# Patient Record
Sex: Female | Born: 1990
Health system: Southern US, Community
[De-identification: ages and names within clinical notes are randomized; demographics above are authoritative.]

## PROBLEM LIST (undated history)

## (undated) DIAGNOSIS — Z789 Other specified health status: Secondary | ICD-10-CM

## (undated) HISTORY — PX: WISDOM TOOTH EXTRACTION: SHX21

---
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
## 2013-03-21 IMAGING — CT CT MAXILLOFACIAL W/ CM
3 series · 16 of 47 positions shown, 19 images · IV contrast (omnipaque)
Comparison: None.

CLINICAL DATA: Facial pain and swelling after tooth extraction on
07/23/2011.

CT MAXILLOFACIAL WITH CONTRAST
TECHNIQUE: Multidetector CT imaging of the maxillofacial
structures was performed with intravenous contrast. Multiplanar CT
image reconstructions were also generated.
Contrast: 75mL OMNIPAQUE IOHEXOL 300 MG/ML IV SOLN

[Series 3: orbit/facial 2.0 h30s · axial · 0.33mm/px · z∈[-246,-98]mm · 10 of 86 slices shown, 13 images]
[im 6/86  brain]
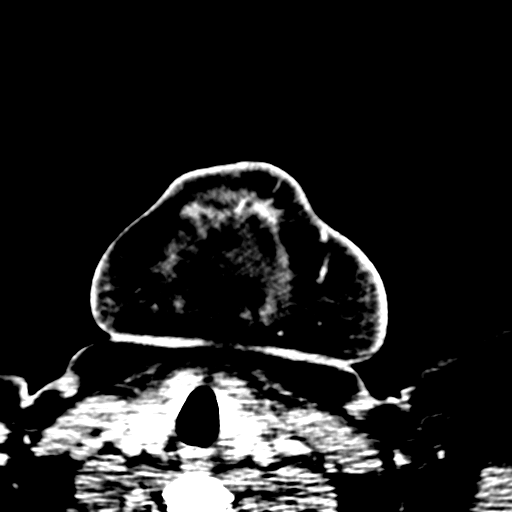
[im 6/86  bone]
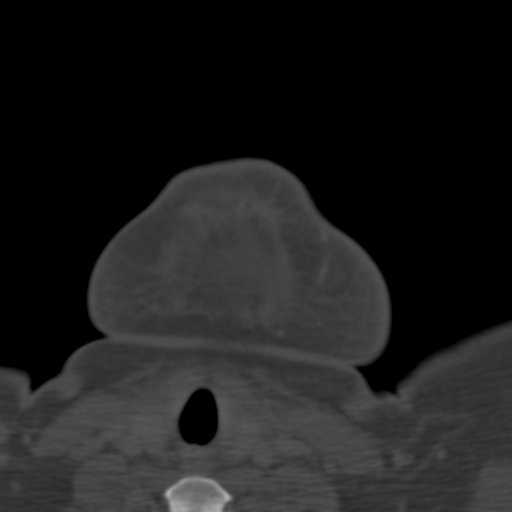
[im 15/86  bone]
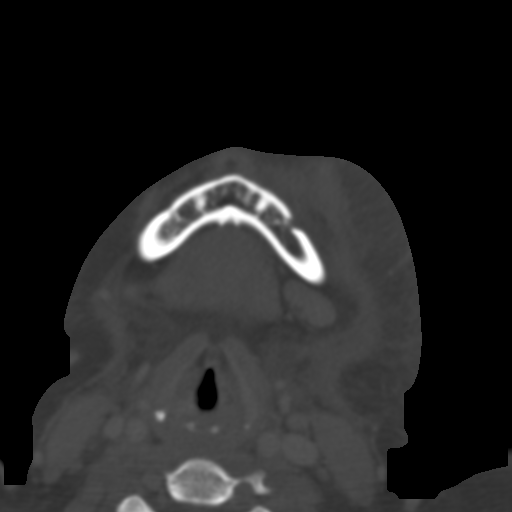
[im 24/86  bone]
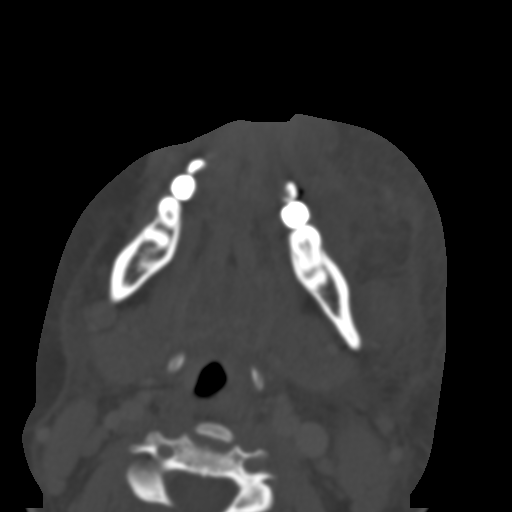
[im 30/86  bone]
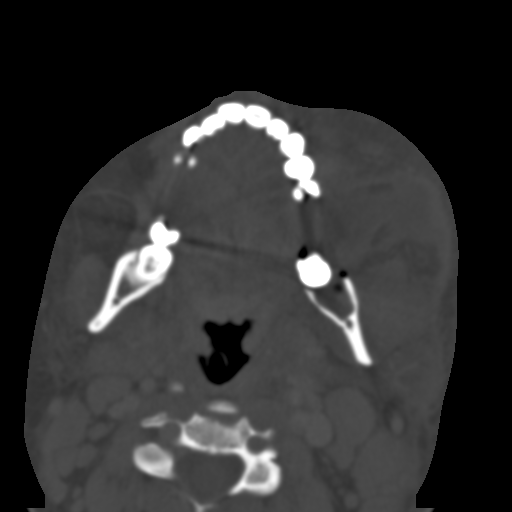
[im 39/86  brain]
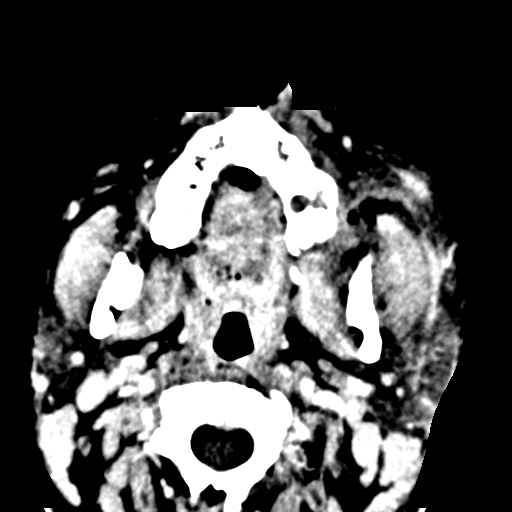
[im 39/86  bone]
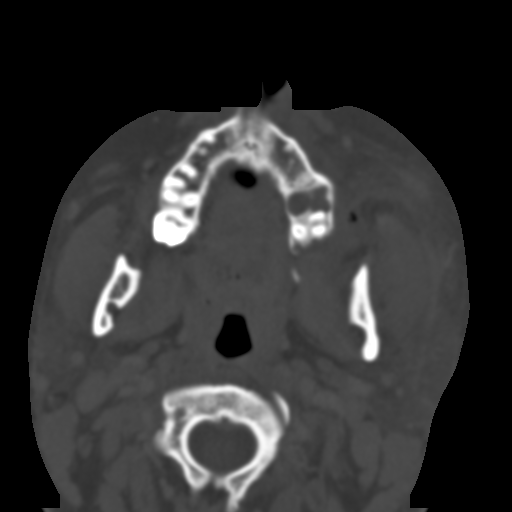
[im 47/86  bone]
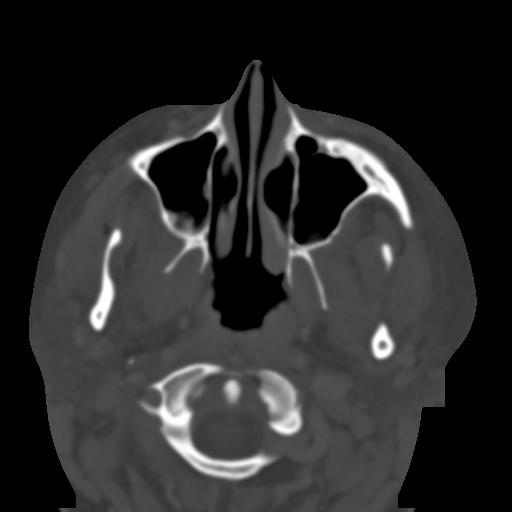
[im 56/86  bone]
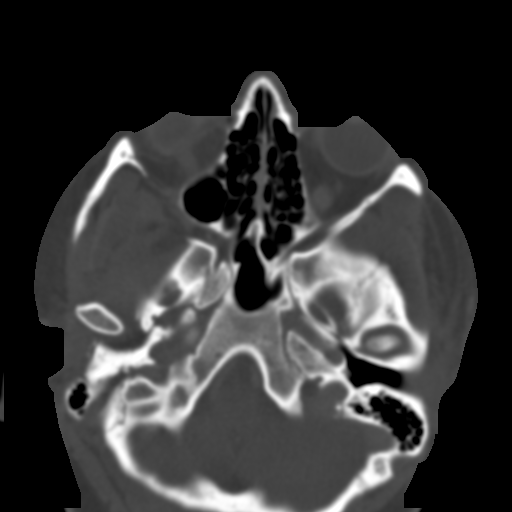
[im 65/86  bone]
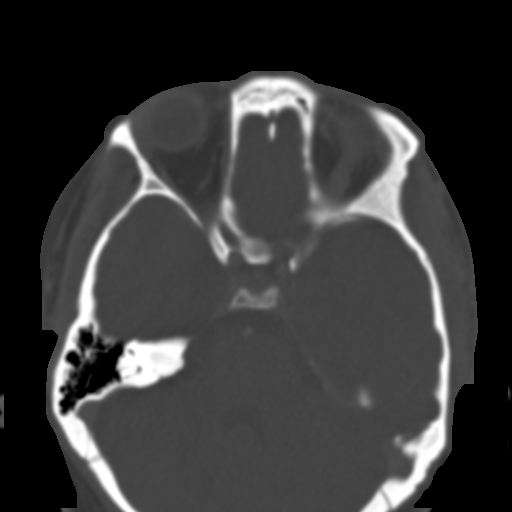
[im 71/86  brain]
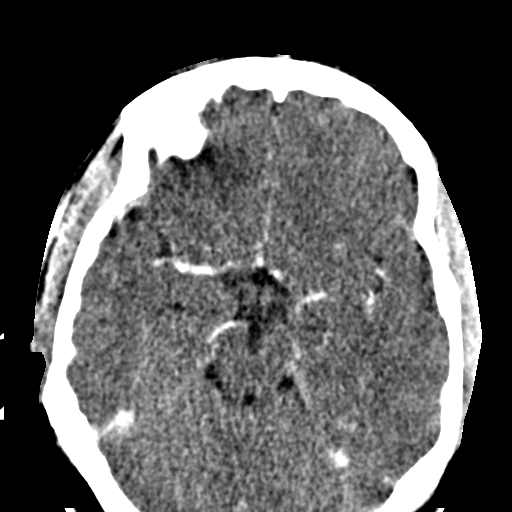
[im 71/86  bone]
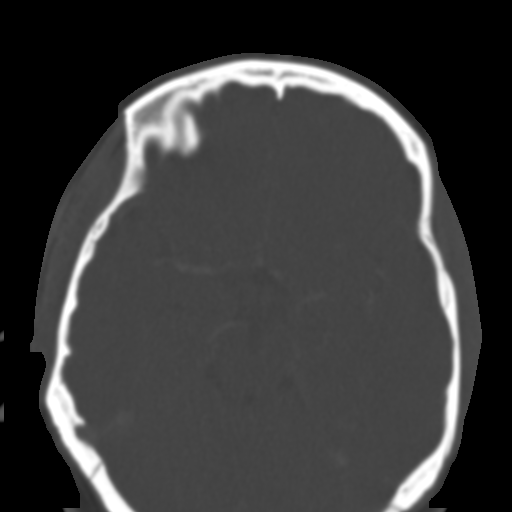
[im 80/86  bone]
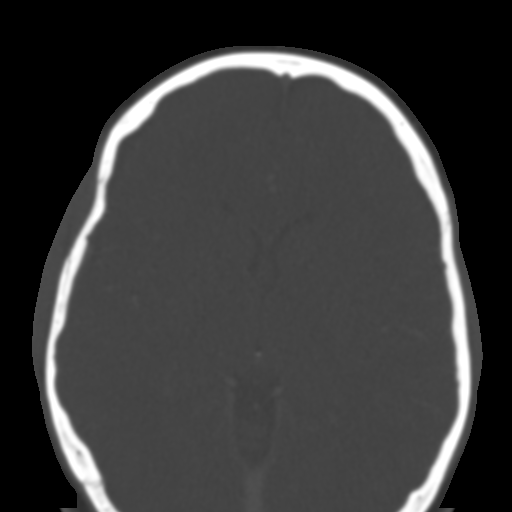

[Series 602: cor soft tissue · coronal · 0.34mm/px · 3 of 65 slices shown]
[im 22/65  bone]
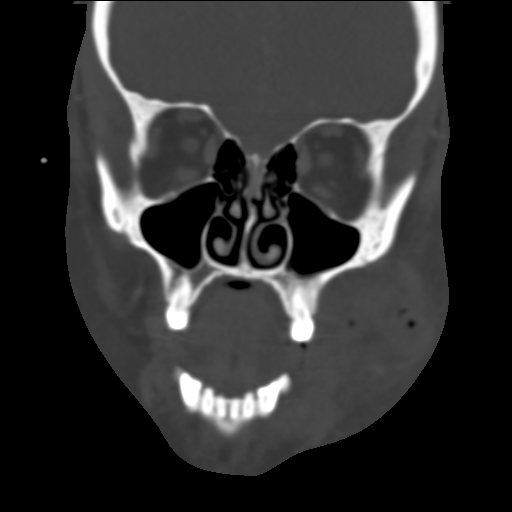
[im 29/65  bone]
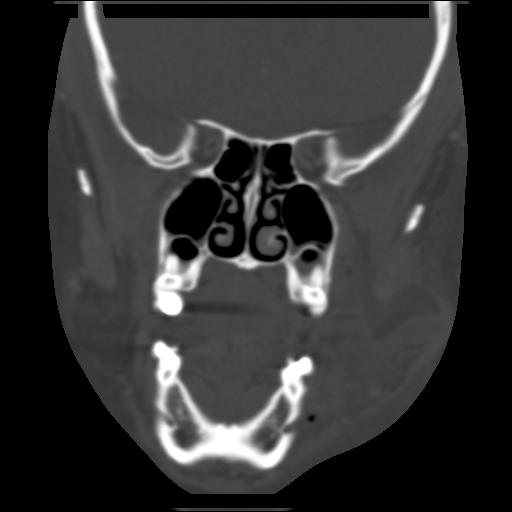
[im 36/65  bone]
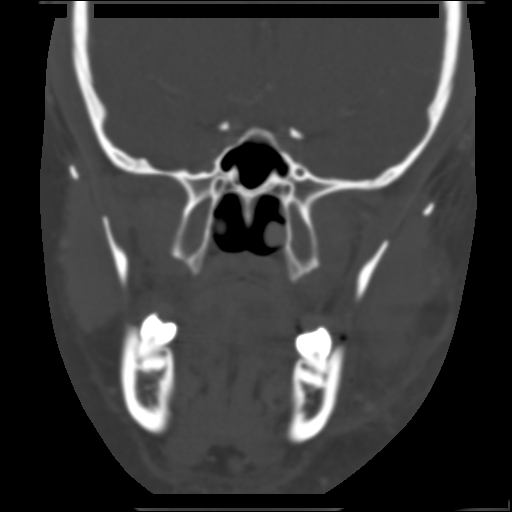

[Series 603: sag soft tissue · sagittal · 0.34mm/px · 3 of 79 slices shown]
[im 27/79  bone]
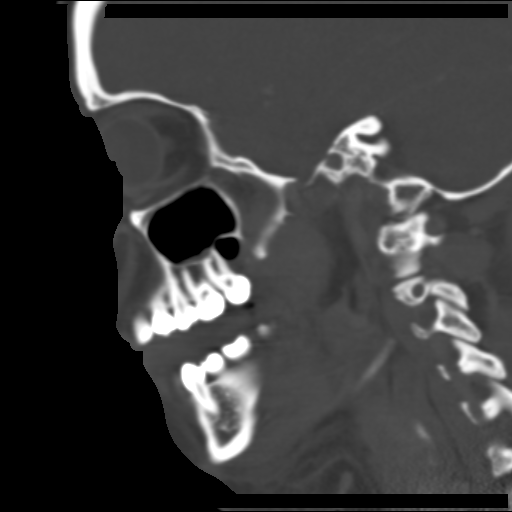
[im 40/79  bone]
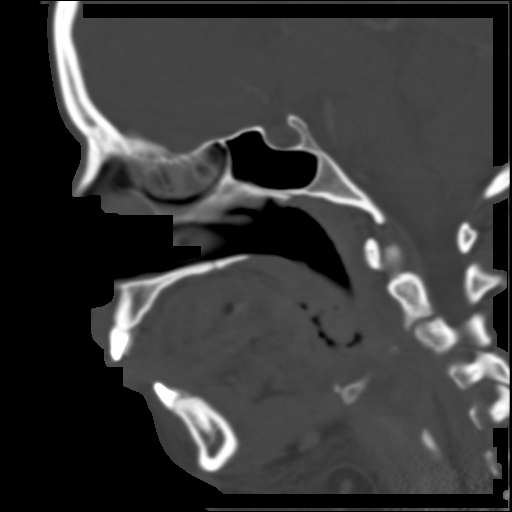
[im 53/79  bone]
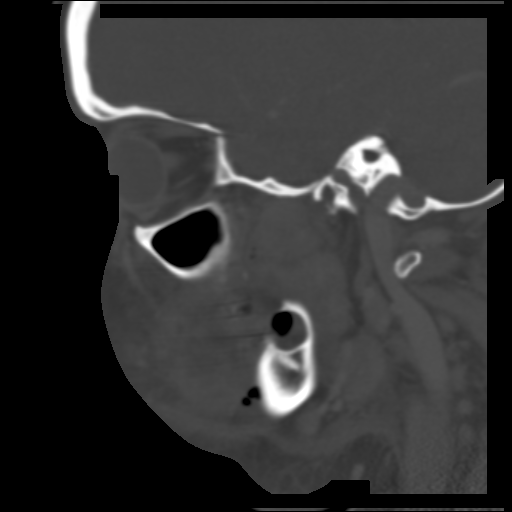

[16 of 47 positions shown; findings below may reference images not displayed]

FINDINGS: The patient had teeth #1, 14, 16, 17, and 32 extracted.
There is an abscess in the soft tissues of the left cheek which
extends adjacent to the sockets of teeth #14, 16, and 17. There is
air in the sockets of teeth # 16 and 17.  The abscess could
originate from any or all of those sockets.  The abscess measures
3.5 x 3.2 x 2.5 cm.

This abscess is deep to the fascia in the buccal subcutaneous fat.

There is slight edema around the left masseter muscle and
superficial to the lateral pterygoid muscle.  There is edema in the
subcutaneous fat of the left cheek and superficial and deep to the
left platysma.  There is slight edema around the inferior aspect of
the left parotid and submandibular glands.

The edema extends across the midline inferiorly adjacent to the
right submandibular gland.

There is bilateral reactive adenopathy in the neck as well as small
nodes in the parotid glands bilaterally.

The patient has minimal mucosal thickening in the bases of both
maxillary sinuses.  Paranasal sinuses are otherwise clear.  Mastoid
air cells are also clear.
IMPRESSION: Abscess in the subcutaneous fat of the left cheek deep to the
fascia.  The abscess extends adjacent to the sockets of teeth 14,
16, and 17.

## 2020-03-14 ENCOUNTER — Other Ambulatory Visit: Payer: Self-pay | Admitting: Obstetrics and Gynecology

## 2020-03-14 DIAGNOSIS — Z3149 Encounter for other procreative investigation and testing: Secondary | ICD-10-CM

## 2020-03-15 ENCOUNTER — Other Ambulatory Visit: Payer: Self-pay | Admitting: Obstetrics and Gynecology

## 2020-03-20 ENCOUNTER — Ambulatory Visit: Payer: Self-pay

## 2020-05-06 ENCOUNTER — Other Ambulatory Visit: Payer: Self-pay

## 2020-05-06 ENCOUNTER — Ambulatory Visit
Admission: RE | Admit: 2020-05-06 | Discharge: 2020-05-06 | Disposition: A | Discharge status: Home / Self Care | Payer: Commercial Managed Care - PPO | Source: Ambulatory Visit | Attending: Obstetrics and Gynecology | Admitting: Obstetrics and Gynecology

## 2020-05-06 DIAGNOSIS — Z3149 Encounter for other procreative investigation and testing: Secondary | ICD-10-CM | POA: Diagnosis not present

## 2020-05-06 DIAGNOSIS — N979 Female infertility, unspecified: Secondary | ICD-10-CM | POA: Diagnosis not present

## 2020-05-06 MED ORDER — IOHEXOL 300 MG/ML  SOLN
20.0000 mL | Freq: Once | INTRAMUSCULAR | Status: AC | PRN
  Administered 2020-05-06: 20 mL

## 2020-05-06 NOTE — Procedures (Signed)
[id number]  [birth year] 28 y.o. @TODAY @  , MD  Hysterosalpingogram Procedure Note  Date of procedure: 05/06/2020   Pre-operative Diagnosis: Infertility  Post-operative Diagnosis: same, patent tubes normal uterus  Procedure: Hysterosalpingogram  Surgeon: 13/07/2019, MD  Assistant(s):  Radiology assistant. The radiologist present for today read the imaging and agreed with findings below.  Anesthesia: None  Estimated Blood Loss:  None         Complications:  None; patient tolerated the procedure well.         Disposition: To home         Condition: stable  Findings: Bilateral fill and spill of the tubes and a normal endometrial contour was noted.  Procedure Details  HSG procedure discussed with the patient.  Risks, complications, alternatives have been reviewed with her and she agrees to proceed.   The patient presented to the radiology lab and was identified as the correct patient and the procedure verified as an HSG. A verbal Time Out was held with all team members present and in agreement.  Speculum was inserted in to the vagina and the cervix visualized.  The cervix was cleaned with betadine solution. The HSG catheter was inserted and the balloon insufflated with approximately 1.5 ml of air.  Patient was then repositioned for fluoroscopy.  A total of 10 ml of contrast was used for the procedure. The patient tolerated the procedure well, no complications.   Bilateral fill and spill of the tubes and a normal endometrial contour was noted.  Results were reviewed with the patient at the time of the procedure. She verbalized understanding.   Cline Cools, MD 05/06/2020

## 2021-07-06 NOTE — L&D Delivery Note (Signed)
Delivery Note  First Stage: Labor onset: 2300 Augmentation : AROM, pitocin Analgesia /Anesthesia intrapartum: epidural x 2 AROM at 0950  Second Stage: Complete dilation at 1420 Onset of pushing at 1422 FHR second stage Category II, recurrent variables. Pushing two contractions then breathing through third contractions to allow fetal recovery  Delivery of a viable female infant 04/25/2022 at 1638 by Lucrezia Europe, CNM. delivery of fetal head in OP position with restitution to ROP. Triple nuchal cord and body cord;  Anterior then posterior shoulders delivered easily with gentle downward traction. Baby placed on mom's chest, and attended to by peds.  Cord double clamped after cessation of pulsation, cut by FOB  Third Stage: Placenta delivered Delena Bali intact with 3 VC @ 1646 Placenta disposition: discarded Uterine tone firm / bleeding scant  2nd degree perineal laceration identified  Anesthesia for repair: epidural Repair 2-0 Vicryl Est. Blood Loss (mL): 403KV  Complications: none  Mom to postpartum.  Baby to Couplet care / Skin to Skin.  Newborn: Birth Weight: TBD, infant skin-to-skin  Apgar Scores: 8, 9 Feeding planned: breastfeeding

## 2021-11-03 DIAGNOSIS — O0993 Supervision of high risk pregnancy, unspecified, third trimester: Secondary | ICD-10-CM | POA: Insufficient documentation

## 2021-12-05 ENCOUNTER — Other Ambulatory Visit: Payer: Self-pay | Admitting: Obstetrics

## 2021-12-05 DIAGNOSIS — Z3689 Encounter for other specified antenatal screening: Secondary | ICD-10-CM

## 2022-01-27 ENCOUNTER — Ambulatory Visit: Payer: PRIVATE HEALTH INSURANCE

## 2022-01-27 ENCOUNTER — Other Ambulatory Visit: Payer: Self-pay

## 2022-01-27 ENCOUNTER — Other Ambulatory Visit: Payer: Self-pay | Admitting: Obstetrics

## 2022-01-27 ENCOUNTER — Ambulatory Visit: Payer: Managed Care, Other (non HMO) | Attending: Obstetrics and Gynecology

## 2022-01-27 DIAGNOSIS — Z363 Encounter for antenatal screening for malformations: Secondary | ICD-10-CM | POA: Insufficient documentation

## 2022-01-27 DIAGNOSIS — E669 Obesity, unspecified: Secondary | ICD-10-CM

## 2022-01-27 DIAGNOSIS — Z3689 Encounter for other specified antenatal screening: Secondary | ICD-10-CM

## 2022-01-27 DIAGNOSIS — Z3A25 25 weeks gestation of pregnancy: Secondary | ICD-10-CM | POA: Diagnosis not present

## 2022-01-27 DIAGNOSIS — O99212 Obesity complicating pregnancy, second trimester: Secondary | ICD-10-CM | POA: Diagnosis present

## 2022-03-05 ENCOUNTER — Other Ambulatory Visit: Payer: Self-pay

## 2022-03-05 DIAGNOSIS — O99213 Obesity complicating pregnancy, third trimester: Secondary | ICD-10-CM

## 2022-03-05 DIAGNOSIS — O0990 Supervision of high risk pregnancy, unspecified, unspecified trimester: Secondary | ICD-10-CM

## 2022-03-10 ENCOUNTER — Other Ambulatory Visit: Payer: Self-pay

## 2022-03-10 ENCOUNTER — Ambulatory Visit: Payer: Managed Care, Other (non HMO) | Attending: Obstetrics and Gynecology

## 2022-03-10 DIAGNOSIS — O99213 Obesity complicating pregnancy, third trimester: Secondary | ICD-10-CM

## 2022-03-10 DIAGNOSIS — E669 Obesity, unspecified: Secondary | ICD-10-CM | POA: Insufficient documentation

## 2022-03-10 DIAGNOSIS — O359XX0 Maternal care for (suspected) fetal abnormality and damage, unspecified, not applicable or unspecified: Secondary | ICD-10-CM

## 2022-03-10 DIAGNOSIS — Z3A31 31 weeks gestation of pregnancy: Secondary | ICD-10-CM | POA: Diagnosis not present

## 2022-03-10 DIAGNOSIS — O0990 Supervision of high risk pregnancy, unspecified, unspecified trimester: Secondary | ICD-10-CM

## 2022-04-02 ENCOUNTER — Encounter
Admission: RE | Admit: 2022-04-02 | Discharge: 2022-04-02 | Disposition: A | Discharge status: Home / Self Care | Payer: Managed Care, Other (non HMO) | Source: Ambulatory Visit | Attending: Anesthesiology | Admitting: Anesthesiology

## 2022-04-02 NOTE — Consult Note (Signed)
[facility] Anesthesia Consultation  Vanessa Jimenez Vanessa Jimenez:[id number] DOB: July 03, 1991 DOA: 04/02/2022 PCP: Marinda Elk, MD   Requesting physician: Lucrezia Europe, CNM Date of consultation: 04/02/22 Reason for consultation: Obesity during pregnancy  CHIEF COMPLAINT:  Obesity during pregnancy  HISTORY OF PRESENT ILLNESS: Vanessa Jimenez  is a 31 y.o. female with a known history of class 3 obesity and GERD with pregnancy.  PAST MEDICAL HISTORY:  No past medical history on file.  PAST SURGICAL HISTORY:  Past Surgical History:  Procedure Laterality Date   WISDOM TOOTH EXTRACTION     All 4    SOCIAL HISTORY:  Social History   Tobacco Use   Smoking status: Never   Smokeless tobacco: Not on file  Substance Use Topics   Alcohol use: No    FAMILY HISTORY:  Family History  Problem Relation Age of Onset   Hypertension Mother    Hypertension Father    Hypertension Brother    Cancer Maternal Grandmother    Hypertension Maternal Grandmother    Hyperlipidemia Maternal Grandfather    Hypertension Maternal Grandfather    Hypertension Paternal Grandfather     DRUG ALLERGIES: No Known Allergies  REVIEW OF SYSTEMS:   RESPIRATORY: No cough, shortness of breath, wheezing.  CARDIOVASCULAR: No chest pain, orthopnea, edema.  HEMATOLOGY: No anemia, easy bruising or bleeding SKIN: No rash or lesion. NEUROLOGIC: No tingling, numbness, weakness.  PSYCHIATRY: No anxiety or depression.   MEDICATIONS AT HOME:  Prior to Admission medications   Medication Sig Start Date End Date Taking? Authorizing Provider  acetaminophen (TYLENOL) 325 MG tablet Take 325 mg by mouth every 6 (six) hours as needed. 1 as needed for pain/headache    [provider]  aspirin 81 MG chewable tablet Chew 81 mg by mouth daily.    [provider]  docusate sodium (COLACE) 50 MG capsule Take 50 mg by mouth daily as needed for mild constipation.     [provider]  lactobacillus acidophilus (BACID) TABS tablet Take 2 tablets by mouth daily. Generic probiotic    [provider]  Prenatal Vit-Fe Fumarate-FA (PRENATAL MULTIVITAMIN) TABS tablet Take 1 tablet by mouth daily at 12 noon.    [provider]      PHYSICAL EXAMINATION:   VITAL SIGNS: Last menstrual period 07/31/2021.  GENERAL:  31 y.o.-year-old patient no acute distress.  HEENT: Head atraumatic, normocephalic. Oropharynx and nasopharynx clear. MP 3, TM distance >3 cm, normal mouth opening. LUNGS: No use of accessory muscles of respiration.   EXTREMITIES: No pedal edema, cyanosis, or clubbing.  NEUROLOGIC: normal gait PSYCHIATRIC: The patient is alert and oriented x 3.  SKIN: No obvious rash, lesion, or ulcer.    IMPRESSION AND PLAN:   Vanessa Jimenez  is a 31 y.o. female presenting with obesity during pregnancy. BMI is currently 45 at [redacted] weeks gestation.   We discussed analgesic options during labor including epidural analgesia. Discussed that in obesity there can be increased difficulty with epidural placement or even failure of successful epidural. We also discussed that even after successful epidural placement there is increased risk of catheter migration out of the epidural space that would require catheter replacement. Discussed use of epidural vs spinal vs GA if cesarean delivery is required. Discussed increased risk of difficult intubation during pregnancy should an emergency cesarean delivery be required.   This patient is appropriate for anesthetic care at [facility] in [place].  We discussed the limitations of a community hospital including but not limited  to staffing, imaging, medications, and blood products.  The patient is aware of these limitations.  All questions answered and concerns addressed.

## 2022-04-07 ENCOUNTER — Other Ambulatory Visit: Payer: PRIVATE HEALTH INSURANCE

## 2022-04-24 ENCOUNTER — Inpatient Hospital Stay
Admission: EM | Admit: 2022-04-24 | Discharge: 2022-04-27 | DRG: 807 | Disposition: A | Discharge status: Home / Self Care | Payer: Managed Care, Other (non HMO) | Attending: Obstetrics | Admitting: Obstetrics

## 2022-04-24 ENCOUNTER — Encounter: Payer: Self-pay | Admitting: Obstetrics and Gynecology

## 2022-04-24 ENCOUNTER — Other Ambulatory Visit: Payer: Self-pay

## 2022-04-24 DIAGNOSIS — O134 Gestational [pregnancy-induced] hypertension without significant proteinuria, complicating childbirth: Principal | ICD-10-CM | POA: Diagnosis present

## 2022-04-24 DIAGNOSIS — O99824 Streptococcus B carrier state complicating childbirth: Secondary | ICD-10-CM | POA: Diagnosis present

## 2022-04-24 DIAGNOSIS — O99214 Obesity complicating childbirth: Secondary | ICD-10-CM | POA: Diagnosis present

## 2022-04-24 DIAGNOSIS — Z7982 Long term (current) use of aspirin: Secondary | ICD-10-CM

## 2022-04-24 DIAGNOSIS — O133 Gestational [pregnancy-induced] hypertension without significant proteinuria, third trimester: Principal | ICD-10-CM | POA: Diagnosis present

## 2022-04-24 DIAGNOSIS — Z3A37 37 weeks gestation of pregnancy: Secondary | ICD-10-CM

## 2022-04-24 HISTORY — DX: Other specified health status: Z78.9

## 2022-04-25 ENCOUNTER — Inpatient Hospital Stay: Payer: Managed Care, Other (non HMO) | Admitting: Anesthesiology

## 2022-04-25 ENCOUNTER — Encounter: Payer: Self-pay | Admitting: Obstetrics and Gynecology

## 2022-04-25 DIAGNOSIS — O99214 Obesity complicating childbirth: Secondary | ICD-10-CM | POA: Diagnosis present

## 2022-04-25 DIAGNOSIS — O26893 Other specified pregnancy related conditions, third trimester: Secondary | ICD-10-CM | POA: Diagnosis present

## 2022-04-25 DIAGNOSIS — Z7982 Long term (current) use of aspirin: Secondary | ICD-10-CM | POA: Diagnosis not present

## 2022-04-25 DIAGNOSIS — O99824 Streptococcus B carrier state complicating childbirth: Secondary | ICD-10-CM | POA: Diagnosis present

## 2022-04-25 DIAGNOSIS — O134 Gestational [pregnancy-induced] hypertension without significant proteinuria, complicating childbirth: Secondary | ICD-10-CM | POA: Diagnosis present

## 2022-04-25 DIAGNOSIS — Z3A37 37 weeks gestation of pregnancy: Secondary | ICD-10-CM | POA: Diagnosis not present

## 2022-04-25 DIAGNOSIS — O133 Gestational [pregnancy-induced] hypertension without significant proteinuria, third trimester: Principal | ICD-10-CM | POA: Diagnosis present

## 2022-04-25 LAB — PROTEIN / CREATININE RATIO, URINE
Creatinine, Urine: 191 mg/dL
Protein Creatinine Ratio: 0.06 mg/mg{Cre} (ref 0.00–0.15)
Total Protein, Urine: 12 mg/dL

## 2022-04-25 LAB — COMPREHENSIVE METABOLIC PANEL
ALT: 21 U/L (ref 0–44)
AST: 20 U/L (ref 15–41)
Albumin: 3 g/dL — ABNORMAL LOW (ref 3.5–5.0)
Alkaline Phosphatase: 125 U/L (ref 38–126)
Anion gap: 9 (ref 5–15)
BUN: 10 mg/dL (ref 6–20)
CO2: 19 mmol/L — ABNORMAL LOW (ref 22–32)
Calcium: 8.7 mg/dL — ABNORMAL LOW (ref 8.9–10.3)
Chloride: 106 mmol/L (ref 98–111)
Creatinine, Ser: 0.43 mg/dL — ABNORMAL LOW (ref 0.44–1.00)
GFR, Estimated: >60 mL/min (ref >60)
Glucose, Bld: 120 mg/dL — ABNORMAL HIGH (ref 70–99)
Potassium: 3.8 mmol/L (ref 3.5–5.1)
Sodium: 134 mmol/L — ABNORMAL LOW (ref 135–145)
Total Bilirubin: 0.9 mg/dL (ref 0.3–1.2)
Total Protein: 7.1 g/dL (ref 6.5–8.1)

## 2022-04-25 LAB — ABO/RH: ABO/RH(D): A POS

## 2022-04-25 LAB — TYPE AND SCREEN
ABO/RH(D): A POS
Antibody Screen: NEGATIVE

## 2022-04-25 LAB — CBC
HCT: 41 % (ref 36.0–46.0)
Hemoglobin: 13.8 g/dL (ref 12.0–15.0)
MCH: 28.6 pg (ref 26.0–34.0)
MCHC: 33.7 g/dL (ref 30.0–36.0)
MCV: 85.1 fL (ref 80.0–100.0)
Platelets: 355 10*3/uL (ref 150–400)
RBC: 4.82 MIL/uL (ref 3.87–5.11)
RDW: 13.6 % (ref 11.5–15.5)
WBC: 15.2 10*3/uL — ABNORMAL HIGH (ref 4.0–10.5)
nRBC: 0 % (ref 0.0–0.2)

## 2022-04-25 LAB — RPR: RPR Ser Ql: NONREACTIVE

## 2022-04-25 MED ORDER — LIDOCAINE-EPINEPHRINE (PF) 1.5 %-1:200000 IJ SOLN
INTRAMUSCULAR | Status: DC | PRN
  Administered 2022-04-25 (×2): 3 mL via EPIDURAL

## 2022-04-25 MED ORDER — BUPIVACAINE HCL (PF) 0.25 % IJ SOLN
INTRAMUSCULAR | Status: DC | PRN
  Administered 2022-04-25: 8 mL via EPIDURAL

## 2022-04-25 MED ORDER — PHENYLEPHRINE 80 MCG/ML (10ML) SYRINGE FOR IV PUSH (FOR BLOOD PRESSURE SUPPORT): 80.0000 ug | PREFILLED_SYRINGE | INTRAVENOUS | Status: DC | PRN

## 2022-04-25 MED ORDER — OXYTOCIN-SODIUM CHLORIDE 30-0.9 UT/500ML-% IV SOLN
2.5000 [IU]/h | INTRAVENOUS | Status: DC
  Administered 2022-04-25: 2.5 [IU]/h via INTRAVENOUS
  Filled 2022-04-25: qty 500

## 2022-04-25 MED ORDER — FENTANYL CITRATE (PF) 100 MCG/2ML IJ SOLN
50.0000 ug | INTRAMUSCULAR | Status: DC | PRN
  Administered 2022-04-25: 100 ug via INTRAVENOUS
  Filled 2022-04-25 (×2): qty 2

## 2022-04-25 MED ORDER — FENTANYL-BUPIVACAINE-NACL 0.5-0.125-0.9 MG/250ML-% EP SOLN
12.0000 mL/h | EPIDURAL | Status: DC | PRN
  Administered 2022-04-25 (×2): 12 mL/h via EPIDURAL
  Filled 2022-04-25: qty 250

## 2022-04-25 MED ORDER — SIMETHICONE 80 MG PO CHEW: 80.0000 mg | CHEWABLE_TABLET | ORAL | Status: DC | PRN

## 2022-04-25 MED ORDER — SODIUM CHLORIDE 0.9 % IV SOLN
5.0000 10*6.[IU] | Freq: Once | INTRAVENOUS | Status: AC
  Administered 2022-04-25: 5 10*6.[IU] via INTRAVENOUS
  Filled 2022-04-25: qty 5

## 2022-04-25 MED ORDER — ONDANSETRON HCL 4 MG PO TABS: 4.0000 mg | ORAL_TABLET | ORAL | Status: DC | PRN

## 2022-04-25 MED ORDER — TERBUTALINE SULFATE 1 MG/ML IJ SOLN: 0.2500 mg | Freq: Once | INTRAMUSCULAR | Status: DC | PRN

## 2022-04-25 MED ORDER — WITCH HAZEL-GLYCERIN EX PADS
1.0000 | MEDICATED_PAD | CUTANEOUS | Status: DC | PRN
  Filled 2022-04-25: qty 100

## 2022-04-25 MED ORDER — COCONUT OIL OIL: 1.0000 | TOPICAL_OIL | Status: DC | PRN

## 2022-04-25 MED ORDER — FENTANYL CITRATE (PF) 100 MCG/2ML IJ SOLN
INTRAMUSCULAR | Status: DC | PRN
  Administered 2022-04-25: 100 ug via EPIDURAL

## 2022-04-25 MED ORDER — COCONUT OIL OIL
TOPICAL_OIL | Status: AC
  Filled 2022-04-25: qty 7.5

## 2022-04-25 MED ORDER — SOD CITRATE-CITRIC ACID 500-334 MG/5ML PO SOLN: 30.0000 mL | ORAL | Status: DC | PRN

## 2022-04-25 MED ORDER — ONDANSETRON HCL 4 MG/2ML IJ SOLN: 4.0000 mg | INTRAMUSCULAR | Status: DC | PRN

## 2022-04-25 MED ORDER — DIBUCAINE (PERIANAL) 1 % EX OINT
1.0000 | TOPICAL_OINTMENT | CUTANEOUS | Status: DC | PRN
  Filled 2022-04-25: qty 28

## 2022-04-25 MED ORDER — LIDOCAINE HCL (PF) 1 % IJ SOLN
INTRAMUSCULAR | Status: DC | PRN
  Administered 2022-04-25: 3 mL via SUBCUTANEOUS

## 2022-04-25 MED ORDER — LACTATED RINGERS IV SOLN
500.0000 mL | INTRAVENOUS | Status: DC | PRN
  Administered 2022-04-25: 500 mL via INTRAVENOUS

## 2022-04-25 MED ORDER — PENICILLIN G POT IN DEXTROSE 60000 UNIT/ML IV SOLN
3.0000 10*6.[IU] | INTRAVENOUS | Status: DC
  Administered 2022-04-25 (×3): 3 10*6.[IU] via INTRAVENOUS
  Filled 2022-04-25 (×3): qty 50

## 2022-04-25 MED ORDER — LIDOCAINE HCL (PF) 1 % IJ SOLN: 30.0000 mL | INTRAMUSCULAR | Status: DC | PRN

## 2022-04-25 MED ORDER — BENZOCAINE-MENTHOL 20-0.5 % EX AERO
1.0000 | INHALATION_SPRAY | CUTANEOUS | Status: DC | PRN
  Filled 2022-04-25: qty 56

## 2022-04-25 MED ORDER — OXYTOCIN BOLUS FROM INFUSION
333.0000 mL | Freq: Once | INTRAVENOUS | Status: AC
  Administered 2022-04-25: 333 mL via INTRAVENOUS

## 2022-04-25 MED ORDER — SENNOSIDES-DOCUSATE SODIUM 8.6-50 MG PO TABS
2.0000 | ORAL_TABLET | Freq: Every day | ORAL | Status: DC
  Administered 2022-04-26 – 2022-04-27 (×2): 2 via ORAL
  Filled 2022-04-25 (×2): qty 2

## 2022-04-25 MED ORDER — EPHEDRINE 5 MG/ML INJ: 10.0000 mg | INTRAVENOUS | Status: DC | PRN

## 2022-04-25 MED ORDER — ZOLPIDEM TARTRATE 5 MG PO TABS: 5.0000 mg | ORAL_TABLET | Freq: Every evening | ORAL | Status: DC | PRN

## 2022-04-25 MED ORDER — DIPHENHYDRAMINE HCL 25 MG PO CAPS: 25.0000 mg | ORAL_CAPSULE | Freq: Four times a day (QID) | ORAL | Status: DC | PRN

## 2022-04-25 MED ORDER — FENTANYL-BUPIVACAINE-NACL 0.5-0.125-0.9 MG/250ML-% EP SOLN
EPIDURAL | Status: AC
  Filled 2022-04-25: qty 250

## 2022-04-25 MED ORDER — SODIUM CHLORIDE 0.9 % IV SOLN
INTRAVENOUS | Status: DC | PRN
  Administered 2022-04-25: 10 mL via EPIDURAL
  Administered 2022-04-25: 8 mL via EPIDURAL

## 2022-04-25 MED ORDER — ACETAMINOPHEN 325 MG PO TABS
650.0000 mg | ORAL_TABLET | ORAL | Status: DC | PRN
  Administered 2022-04-26: 650 mg via ORAL
  Filled 2022-04-25: qty 2

## 2022-04-25 MED ORDER — LACTATED RINGERS IV SOLN: INTRAVENOUS | Status: DC

## 2022-04-25 MED ORDER — TETANUS-DIPHTH-ACELL PERTUSSIS 5-2.5-18.5 LF-MCG/0.5 IM SUSY: 0.5000 mL | PREFILLED_SYRINGE | Freq: Once | INTRAMUSCULAR | Status: DC

## 2022-04-25 MED ORDER — ONDANSETRON HCL 4 MG/2ML IJ SOLN
4.0000 mg | Freq: Four times a day (QID) | INTRAMUSCULAR | Status: DC | PRN
  Administered 2022-04-25: 4 mg via INTRAVENOUS
  Filled 2022-04-25: qty 2

## 2022-04-25 MED ORDER — OXYTOCIN-SODIUM CHLORIDE 30-0.9 UT/500ML-% IV SOLN
1.0000 m[IU]/min | INTRAVENOUS | Status: DC
  Administered 2022-04-25: 2 m[IU]/min via INTRAVENOUS

## 2022-04-25 MED ORDER — IBUPROFEN 600 MG PO TABS
600.0000 mg | ORAL_TABLET | Freq: Four times a day (QID) | ORAL | Status: DC
  Administered 2022-04-25 – 2022-04-27 (×7): 600 mg via ORAL
  Filled 2022-04-25 (×7): qty 1

## 2022-04-25 MED ORDER — PRENATAL MULTIVITAMIN CH
1.0000 | ORAL_TABLET | Freq: Every day | ORAL | Status: DC
  Administered 2022-04-26: 1 via ORAL
  Filled 2022-04-25: qty 1

## 2022-04-25 MED ORDER — ACETAMINOPHEN 325 MG PO TABS: 650.0000 mg | ORAL_TABLET | ORAL | Status: DC | PRN

## 2022-04-25 MED ORDER — LACTATED RINGERS IV SOLN
500.0000 mL | Freq: Once | INTRAVENOUS | Status: AC
  Administered 2022-04-25: 500 mL via INTRAVENOUS

## 2022-04-25 MED ORDER — DIPHENHYDRAMINE HCL 50 MG/ML IJ SOLN: 12.5000 mg | INTRAMUSCULAR | Status: DC | PRN

## 2022-04-25 NOTE — Anesthesia Preprocedure Evaluation (Addendum)
Anesthesia Evaluation  Patient identified by MRN, date of birth, ID band Patient awake    Reviewed: Allergy & Precautions, NPO status , Patient's Chart, lab work & pertinent test results  History of Anesthesia Complications Negative for: history of anesthetic complications  Airway Mallampati: III   Neck ROM: Full    Dental no notable dental hx.    Pulmonary neg pulmonary ROS,    Pulmonary exam normal breath sounds clear to auscultation       Cardiovascular Exercise Tolerance: Good negative cardio ROS Normal cardiovascular exam Rhythm:Regular Rate:Normal     Neuro/Psych negative neurological ROS     GI/Hepatic negative GI ROS,   Endo/Other  Class 3 obesity  Renal/GU negative Renal ROS     Musculoskeletal   Abdominal   Peds  Hematology negative hematology ROS (+)   Anesthesia Other Findings 31 yo G1P0 at 61 2/7 requesting labor epidural.  Reproductive/Obstetrics                            Anesthesia Physical Anesthesia Plan  ASA: 3  Anesthesia Plan: Epidural   Post-op Pain Management:    Induction:   PONV Risk Score and Plan: 2 and Treatment may vary due to age or medical condition  Airway Management Planned: Natural Airway  Additional Equipment:   Intra-op Plan:   Post-operative Plan:   Informed Consent: I have reviewed the patients History and Physical, chart, labs and discussed the procedure including the risks, benefits and alternatives for the proposed anesthesia with the patient or authorized representative who has indicated his/her understanding and acceptance.     Dental Advisory Given  Plan Discussed with:   Anesthesia Plan Comments: (Patient reports no bleeding problems and no anticoagulant use.   Patient consented for risks of anesthesia including but not limited to:  - adverse reactions to medications - risk of bleeding, infection and or nerve damage  from epidural that could lead to paralysis - risk of headache or failed epidural - nerve damage due to positioning - that if epidural is used for C-section that there is a chance of epidural failure requiring spinal placement or conversion to GA - Damage to heart, brain, lungs, other parts of body or loss of life  Patient voiced understanding.)        Anesthesia Quick Evaluation

## 2022-04-25 NOTE — Progress Notes (Signed)
Patient ID: Vanessa Jimenez, female   DOB: 1990/07/16, 31 y.o.   MRN: [id number] Chart and fetal stripe reviewed

## 2022-04-25 NOTE — Anesthesia Procedure Notes (Signed)
Epidural Patient location during procedure: OB Start time: 04/25/2022 4:10 AM End time: 04/25/2022 4:21 AM  Staffing Anesthesiologist: Darrin Nipper, MD Performed: anesthesiologist   Preanesthetic Checklist Completed: patient identified, IV checked, risks and benefits discussed, surgical consent, monitors and equipment checked, pre-op evaluation and timeout performed  Epidural Patient position: sitting Prep: Betadine Patient monitoring: heart rate, continuous pulse ox and blood pressure Approach: midline Location: L3-L4 Injection technique: LOR air  Needle:  Needle type: Tuohy  Needle gauge: 17 G Needle length: 9 cm Needle insertion depth: 7 cm Catheter at skin depth: 12 cm Test dose: negative and 1.5% lidocaine with Epi 1:200 K  Assessment Sensory level: T4  Additional Notes Two attempts to find midline due to patient positioning.  Once found, straightforward placement without apparent complications. Reason for block:procedure for pain

## 2022-04-25 NOTE — Anesthesia Procedure Notes (Signed)
Epidural Patient location during procedure: OB Start time: 04/25/2022 11:35 AM End time: 04/25/2022 12:00 PM  Staffing Anesthesiologist: Arita Miss, MD Performed: anesthesiologist   Preanesthetic Checklist Completed: patient identified, IV checked, site marked, risks and benefits discussed, surgical consent, monitors and equipment checked, pre-op evaluation and timeout performed  Epidural Patient position: sitting Prep: ChloraPrep Patient monitoring: heart rate, continuous pulse ox and blood pressure Approach: midline Location: L2-L3 Injection technique: LOR saline  Needle:  Needle type: Tuohy  Needle gauge: 17 G Needle length: 9 cm Needle insertion depth: 6 cm Catheter type: closed end flexible Catheter size: 19 Gauge Catheter at skin depth: 12 cm Test dose: negative and 1.5% lidocaine with Epi 1:200 K  Assessment Sensory level: T10 Events: blood not aspirated, injection not painful, no injection resistance, no paresthesia and negative IV test  Additional Notes Second overall epidural insertion - one easy attempt for this one.  Pt. Evaluated and documentation done after procedure finished. Patient identified. Risks/Benefits/Options discussed with patient including but not limited to bleeding, infection, nerve damage, paralysis, failed block, incomplete pain control, headache, blood pressure changes, nausea, vomiting, reactions to medication both or allergic, itching and postpartum back pain. Confirmed with bedside nurse the patient's most recent platelet count. Confirmed with patient that they are not currently taking any anticoagulation, have any bleeding history or any family history of bleeding disorders. Patient expressed understanding and wished to proceed. All questions were answered. Sterile technique was used throughout the entire procedure. Please see nursing notes for vital signs. Test dose was given through epidural catheter and negative prior to continuing to dose  epidural or start infusion. Warning signs of high block given to the patient including shortness of breath, tingling/numbness in hands, complete motor block, or any concerning symptoms with instructions to call for help. Patient was given instructions on fall risk and not to get out of bed. All questions and concerns addressed with instructions to call with any issues or inadequate analgesia.     Patient tolerated the insertion well without immediate complications.  Reason for block: procedure for painReason for block:procedure for pain

## 2022-04-25 NOTE — Progress Notes (Signed)
Labor Progress Note  Vanessa Jimenez is a 31 y.o. G1P0 at [redacted]w[redacted]d by ultrasound admitted for active labor  Subjective: She still feels pain on her left side with contractions, but states it is overall improving  Objective: BP (!) 149/94   Pulse (!) 109   Temp 98.4 F (36.9 C) (Oral)   Resp 16   Ht 5\' 4"  (1.626 m)   Wt 120.7 kg   LMP 07/31/2021   SpO2 97%   BMI 45.68 kg/m  Notable VS details: reviewed  Fetal Assessment: FHT:  FHR: 130 bpm, variability: moderate,  accelerations:  Present,  decelerations:  Present recurrent early decelerations Category/reactivity:  Category I UC:   regular, every 7-10 minutes, MVUs 80 SVE:    From last exam: 6/80/-1 Membrane status:AROM @ 3016 Amniotic color: clear  Labs: Lab Results  Component Value Date   WBC 15.2 (H) 04/25/2022   HGB 13.8 04/25/2022   HCT 41.0 04/25/2022   MCV 85.1 04/25/2022   PLT 355 04/25/2022    Assessment / Plan: Spontaneous labor, ineffective contraction pattern  Labor:  Her contractions have spaced out to once every 7-10 minutes, discussed augmenting with pitocin and she is agreeable. Pitocin augmentation ordered.  Titrate until adequate MVUs. Preeclampsia:  labs stable Fetal Wellbeing:  Category I Pain Control:  Epidural I/D:   GBS pos, treated x 3 Anticipated MOD:  NSVD  Gertie Fey, CNM 04/25/2022, 10:55 AM

## 2022-04-25 NOTE — Progress Notes (Signed)
Labor Progress Note  Vanessa Jimenez is a 31 y.o. G1P0 at [redacted]w[redacted]d by ultrasound admitted for active labor  Subjective: She is comfortable after having her epidural replaced. Feels rectal pressure and like she needs to push with each contraction  Objective: BP 127/63   Pulse 84   Temp 98.4 F (36.9 C) (Oral)   Resp 18   Ht 5\' 4"  (1.626 m)   Wt 120.7 kg   LMP 07/31/2021   SpO2 99%   BMI 45.68 kg/m  Notable VS details: reviewed  Fetal Assessment: FHT:  FHR: 130 bpm, variability: moderate,  accelerations:  Present,  decelerations:  Present intermittent variable decelerations Category/reactivity:  Category II UC:   regular, every 2-3 minutes SVE:    Dilation: 10cm  Effacement: 100%  Station:  +2  Consistency: soft  Position: anterior  Membrane status: AROM @ 0950 Amniotic color: clear  Labs: Lab Results  Component Value Date   WBC 15.2 (H) 04/25/2022   HGB 13.8 04/25/2022   HCT 41.0 04/25/2022   MCV 85.1 04/25/2022   PLT 355 04/25/2022    Assessment / Plan: Spontaneous labor, augmented with pitocin, now second stage of labor  Labor:  Currently on 5mu/min pitocin. Feeling rectal pressure with contractions. Begin pushing. Good maternal effort and good fetal descent with pushing. Preeclampsia:  labs stable Fetal Wellbeing:  Category II Pain Control:  Epidural I/D:   GBS positive, treated x  3 Anticipated MOD:  NSVD  Gertie Fey, CNM 04/25/2022, 2:23 PM

## 2022-04-25 NOTE — Discharge Summary (Signed)
Obstetrical Discharge Summary  Patient Name: Vanessa Jimenez DOB: [birth year] MRN: [id number]  Date of Admission: 04/24/2022 Date of Delivery: 04/25/22 Delivered by: Lucrezia Europe, CNM Date of Discharge: 04/27/22  Primary OB: [facility]  ENI:DPOEUMP'N last menstrual period was 07/31/2021. EDC Estimated Date of Delivery: 05/14/22 Gestational Age at Delivery: [redacted]w[redacted]d   Antepartum complications:  1. BMI 41 pre-preg 2. 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 She was augmented with pitocin and had her epidural replaced. She progressed to 10/100/+2 and pushed 2 hours with recurrent variable decelerations, delivering viable female infant with triple nuchal cord and body cord and direct OP over 2nd degree perineal laceration. Apgars 8/9. Date of Delivery: 04/25/22 Delivered By: Lucrezia Europe, CNM Delivery Type: spontaneous vaginal delivery Anesthesia: epidural Placenta: spontaneous Laceration: 2nd degree perineal Episiotomy: none Newborn Data: Live born female "Vanessa Jimenez" Birth Weight:  5lb 12.8 oz APGAR: 8, 9  Newborn Delivery   Birth date/time: 04/25/2022 16:38:00 Delivery type: Vaginal, Spontaneous     Postpartum Procedures:  [place]:     04/26/2022    3:10 AM  Vanessa Jimenez Postnatal Depression Scale Screening Tool  I have been able to laugh and see the funny side of things. 0  I have looked forward with enjoyment to things. 0  I have blamed myself unnecessarily when things went wrong. 2  I have been anxious or worried for no good  reason. 2  I have felt scared or panicky for no good reason. 1  Things have been getting on top of me. 1  I have been so unhappy that I have had difficulty sleeping. 0  I have felt sad or miserable. 0  I have been so unhappy that I have been crying. 0  The thought of harming myself has occurred to me. 0  Edinburgh Postnatal Depression Scale Total 6    Post partum course:  Patient had an uncomplicated postpartum course.  By time of discharge on PPD#2, her pain was controlled on oral pain medications; she had appropriate lochia and was ambulating, voiding without difficulty and tolerating regular diet.  She was deemed stable for discharge to home.    Discharge Physical Exam:  BP 128/81 (BP Location: Right Arm)   Pulse 73   Temp 97.9 F (36.6 C) (Oral)   Resp 20   Ht 5\' 4"  (1.626 m)   Wt 120.7 kg   LMP 07/31/2021   SpO2 99% Comment: Room Air  Breastfeeding Unknown   BMI 45.68 kg/m   General: NAD CV: RRR Pulm: CTABL, nl effort ABD: s/nd/nt, fundus firm and below the umbilicus Lochia: moderate Perineum: well approximated/intact DVT Evaluation: LE non-ttp, no evidence of DVT on exam.  Hemoglobin  Date Value Ref Range Status  04/26/2022 12.7 12.0 - 15.0 g/dL Final   HCT  Date Value Ref Range Status  04/26/2022 38.1 36.0 - 46.0 % Final     Disposition: stable, discharge to home. Baby Feeding: breastmilk Baby Disposition: home with mom  Rh Immune globulin given: n/a, A pos Rubella vaccine given: immune Varicella vaccine given: immune Tdap vaccine given in AP or PP setting: 03/25/22 Flu vaccine given in AP or PP setting: 03/25/22  Contraception: POP'S  Prenatal Labs:  Blood type/Rh A Pos  Antibody screen neg  Rubella Immune  Varicella Immune  RPR NR  HBsAg Neg  HIV NR  GC neg  Chlamydia neg  Genetic screening  declined  1 hour GTT  118  3 hour GTT    GBS  POS     Plan:  Vanessa Jimenez was discharged to home in good condition. Follow-up appointment  with delivering provider in 6 weeks.  Discharge Medications: Allergies as of 04/27/2022   No Known Allergies      Medication List     STOP taking these medications    aspirin 81 MG chewable tablet   docusate sodium 50 MG capsule Commonly known as: COLACE       TAKE these medications    acetaminophen 325 MG tablet Commonly known as: Tylenol Take 2 tablets (650 mg total) by mouth every 4 (four) hours as needed (for pain scale < 4). What changed:  how much to take when to take this reasons to take this additional instructions   benzocaine-Menthol 20-0.5 % Aero Commonly known as: DERMOPLAST Apply 1 Application topically as needed for irritation (perineal discomfort).   coconut oil Oil Apply 1 Application topically as needed.   dibucaine 1 % Oint Commonly known as: NUPERCAINAL Place 1 Application rectally as needed for hemorrhoids.   ibuprofen 600 MG tablet Commonly known as: ADVIL Take 1 tablet (600 mg total) by mouth every 6 (six) hours.   lactobacillus acidophilus Tabs tablet Take 2 tablets by mouth daily. Generic probiotic   prenatal multivitamin Tabs tablet Take 1 tablet by mouth daily at 12 noon.   senna-docusate 8.6-50 MG tablet Commonly known as: Senokot-S Take 2 tablets by mouth daily. Start taking on: April 28, 2022   simethicone 80 MG chewable tablet Commonly known as: MYLICON Chew 1 tablet (80 mg total) by mouth as needed for flatulence.   witch hazel-glycerin pad Commonly known as: TUCKS Apply 1 Application topically as needed for hemorrhoids.         Follow-up Information     Janyce Llanos, CNM Follow up in 6 week(s).   Specialty: Certified Nurse Midwife Why: 6wk postpartum Contact information: [address] [place] [address] [phone]                 Signed:  Sonny Dandy, CNM 04/27/2022 11:24 AM

## 2022-04-25 NOTE — H&P (Signed)
OB History & Physical   History of Present Illness:  Chief Complaint: contractions  HPI:  Vanessa Jimenez is a 31 y.o. G1P0 female at [redacted]w[redacted]d dated by [place] at [redacted]w[redacted]d; EDD 05/14/22.  She presents to L&D for painful UCs occurring all day today and now q5-7min, reports active FM, denies LOF or VB. Noted to have mild rnage BP on admit, decision to admit in early labor due to mild range BP and painful UCs.    Pregnancy Issues: 1. BMI 41 pre-preg 2. 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 Authorizing Provider  aspirin 81 MG chewable tablet Chew 81 mg by mouth daily.   Yes [provider]  lactobacillus acidophilus (BACID) TABS tablet Take 2 tablets by mouth daily. Generic probiotic   Yes [provider]  Prenatal Vit-Fe Fumarate-FA (PRENATAL MULTIVITAMIN) TABS tablet Take 1 tablet by mouth daily at 12 noon.   Yes [provider]  acetaminophen (TYLENOL) 325 MG tablet Take 325 mg by mouth every 6 (six) hours as needed. 1 as needed for pain/headache    [provider]  docusate sodium (COLACE) 50 MG capsule Take 50 mg by mouth daily as needed for mild constipation.    [provider]     Prenatal care site: [facility]   Social History: She  reports that she has never smoked. She does not have any smokeless tobacco history on file. She reports that she does not drink alcohol and does not use drugs.  Family History: family history includes Cancer in her maternal grandmother; Hyperlipidemia in her maternal grandfather; Hypertension in her brother, father, maternal grandfather, maternal grandmother, mother, and paternal grandfather.   Review  of Systems: A full review of systems was performed and negative except as noted in the HPI.     Physical Exam:  Vital Signs: BP 132/81 (BP Location: Left Arm)   Pulse 81   Temp 98.2 F (36.8 C) (Oral)   Ht 5\' 4"  (1.626 m)   Wt 120.7 kg   LMP 07/31/2021   BMI 45.66 kg/m  Vitals:   04/24/22 2344 04/25/22 0110 04/25/22 0200 04/25/22 0215  BP: (!) 142/90 137/81 107/61 (!) 136/57   04/25/22 0230 04/25/22 0252  BP: 130/78 132/81    General: no acute distress. But crying with UCs, c/w severe back pain.  HEENT: normocephalic, atraumatic Heart: regular rate & rhythm.  No murmurs/rubs/gallops Lungs: clear to auscultation bilaterally, normal respiratory effort Abdomen: soft, gravid, non-tender;  EFW: 8lbs Pelvic:   External: Normal external female genitalia  Cervix: 4/80/-2, intact BOW noted.    Extremities: non-tender, symmetric, no edema bilaterally.  DTRs: 2+  Neurologic: Alert & oriented x 3.    Results for orders placed or performed during the hospital encounter of 04/24/22 (from the past 24 hour(s))  Protein / creatinine ratio, urine     Status: None   Collection Time: 04/25/22  1:26 AM  Result Value Ref Range   Creatinine, Urine 191 mg/dL   Total Protein, Urine 12 mg/dL   Protein Creatinine Ratio 0.06 0.00 - 0.15 mg/mg[Cre]  CBC     Status: Abnormal   Collection Time: 04/25/22  1:28 AM  Result Value Ref Range   WBC 15.2 (H) 4.0 - 10.5 K/uL   RBC 4.82 3.87 - 5.11 MIL/uL   Hemoglobin 13.8 12.0 - 15.0 g/dL   HCT 41.0 36.0 - 46.0 %   MCV 85.1 80.0 - 100.0 fL   MCH 28.6 26.0 - 34.0 pg   MCHC 33.7 30.0 - 36.0 g/dL   RDW 13.6 11.5 - 15.5 %   Platelets 355 150 - 400 K/uL   nRBC 0.0 0.0 - 0.2 %  Type and screen [place]     Status: None   Collection Time: 04/25/22  1:28 AM  Result Value Ref Range   ABO/RH(D) A POS    Antibody Screen NEG    Sample Expiration      04/28/2022,2359 Performed at [facility], [address] [place],  [place] [address]   Comprehensive metabolic panel     Status: Abnormal   Collection Time: 04/25/22  1:28 AM  Result Value Ref Range   Sodium 134 (L) 135 - 145 mmol/L   Potassium 3.8 3.5 - 5.1 mmol/L   Chloride 106 98 - 111 mmol/L   CO2 19 (L) 22 - 32 mmol/L   Glucose, Bld 120 (H) 70 - 99 mg/dL   BUN 10 6 - 20 mg/dL   Creatinine, Ser 0.43 (L) 0.44 - 1.00 mg/dL   Calcium 8.7 (L) 8.9 - 10.3 mg/dL   Total Protein 7.1 6.5 - 8.1 g/dL   Albumin 3.0 (L) 3.5 - 5.0 g/dL   AST 20 15 - 41 U/L   ALT 21 0 - 44 U/L   Alkaline Phosphatase 125 38 - 126 U/L   Total Bilirubin 0.9 0.3 - 1.2 mg/dL   GFR, Estimated >60 >60 mL/min   Anion gap 9 5 - 15    Pertinent Results:  Prenatal Labs: Blood type/Rh A Pos  Antibody screen neg  Rubella Immune  Varicella Immune  RPR NR  HBsAg Neg  HIV NR  GC neg  Chlamydia neg  Genetic screening  declined  1 hour GTT  118  3 hour GTT   GBS  POS   FHT: 130bpm, + accels, mod var, no decels noted.  TOCO: q 2-7min, palp mild to mod SVE:  4/80/-2 with BBOW   Cephalic by leopolds/SVE  No results found.  Assessment:  Vanessa Jimenez is a 31 y.o. G1P0 female at [redacted]w[redacted]d with early labor and GHTN.   Plan:  1. Admit to Labor & Delivery; consents reviewed and obtained - No e/o Pre-E, normal labs. Intermittent mild range BPs.   2. Fetal Well being  - Fetal Tracing: Cat I - Group B Streptococcus ppx indicated: POS - Presentation: cephalic confirmed by exam   3. Routine OB: - Prenatal labs reviewed, as above - Rh A Pos - CBC, T&S, RPR on admit - Clear fluids, IVF  4. Monitoring of Labor -  Contractions: external toco in place; not tracing well due to maternal habitus.  -  Pelvis adequate for TOL -  Plan for augmentation with AROM, Pitocin if needed, expectant mgmt for now.  -  Plan for continuous fetal monitoring  -  Maternal pain control as desired, s/p 1 dose of IVPM.  - Anticipate vaginal delivery  5. Post Partum Planning: - Infant feeding: breast -  Contraception: TBD - Tdap and Flu given 03/25/22  Francetta Found, CNM 04/25/22 3:42 AM

## 2022-04-25 NOTE — Progress Notes (Signed)
Labor Progress Note  Vanessa Jimenez is a 31 y.o. G1P0 at [redacted]w[redacted]d by ultrasound admitted for active labor  Subjective: She has an epidural but is experiencing pain with contractions, mostly in her left hip  Objective: BP (!) 149/94   Pulse (!) 109   Temp 97.9 F (36.6 C) (Oral)   Resp 16   Ht 5\' 4"  (1.626 m)   Wt 120.7 kg   LMP 07/31/2021   SpO2 97%   BMI 45.68 kg/m  Vitals:   04/25/22 0322 04/25/22 0338 04/25/22 0427 04/25/22 0430  BP: (!) 137/102 132/78 124/69 130/71   04/25/22 0445 04/25/22 0649 04/25/22 0757 04/25/22 0905  BP: (!) 103/57 129/67 139/84 124/76   04/25/22 0907 04/25/22 0912 04/25/22 0917 04/25/22 0922  BP: 126/69 136/82 137/80 (!) 149/94    Notable VS details: reviewed  Fetal Assessment: FHT:  FHR: 135 bpm, variability: moderate,  accelerations:  Present,  decelerations:  Absent Category/reactivity:  Category I UC:   regular, every 3-6 minutes SVE:    Dilation: 6cm  Effacement: 80%  Station:  -1  Consistency: medium  Position: middle  Membrane status:AROM @ 0950 Amniotic color: clear  Labs: Lab Results  Component Value Date   WBC 15.2 (H) 04/25/2022   HGB 13.8 04/25/2022   HCT 41.0 04/25/2022   MCV 85.1 04/25/2022   PLT 355 04/25/2022    Assessment / Plan: Spontaneous labor, progressing normally  Labor:  Progressing normally. AROM with clear fluid, then IUPC and FSE placement for facilitate better fetal heart rate tracing.  Preeclampsia:  labs stable Fetal Wellbeing:  Category I Pain Control:  Epidural, her epidural was rebolused by anesthesia and she is still reporting poor coverage on her left side. Anesthesia aware of her continued reports of pain and is monitoring epidural coverage.  I/D:   GBS pos, treated with initial PCN dose then subsequent dose immediately after initial dose. Received third PCN 4hrs later. Adequately treated. Anticipated MOD:  NSVD  Gertie Fey, CNM 04/25/2022, 10:02 AM

## 2022-04-25 NOTE — Lactation Note (Signed)
This note was copied from a baby's chart. Lactation Consultation Note  Patient Name: Vanessa Jimenez [facility] Date: 04/25/2022 Reason for consult: Follow-up assessment;Primapara;1st time breastfeeding;Early term 37-38.6wks;Infant < 6lbs Age:29 hours  Maternal Data Has patient been taught Hand Expression?: Yes Does the patient have breastfeeding experience prior to this delivery?: No  Feeding Mother's Current Feeding Choice: Breast Milk First attempt at breast was unsucessful, baby rooting on mom's chest, attempted on both breasts after hand expression, more colostrum on right, but nipples flat and baby unable to pull nipple in mouth, mom has large breast and baby not strong enough to maintain latch, returned later and initiated 20 mm nipple shield on right, baby in football hold, baby would latch but would not suck at first because he was sleepy, eventually began to nurse and was still nursing at 10 min when I left room, occ stimulated to nurse but was maintaining latch LATCH Score Latch: Repeated attempts needed to sustain latch, nipple held in mouth throughout feeding, stimulation needed to elicit sucking reflex.  Audible Swallowing: A few with stimulation  Type of Nipple: Flat  Comfort (Breast/Nipple): Soft / non-tender  Hold (Positioning): Assistance needed to correctly position infant at breast and maintain latch.  LATCH Score: 6   Lactation Tools Discussed/Used Tools: Nipple Shields Nipple shield size: 20  Interventions Interventions:  (20 mm nipple shield) Offer breast q 2-3 hr because of early term and wt below 6#  Discharge Pump: Personal [place] Program: No  Consult Status Consult Status: Follow-up from L&D    Vanessa Jimenez 04/25/2022, 7:04 PM

## 2022-04-25 NOTE — Progress Notes (Signed)
Patient transferred to [facility]. EFM applied, difficulty monitoring continuous heart tones due to maternal movement. RN remains at bedside.

## 2022-04-26 LAB — CBC
HCT: 38.1 % (ref 36.0–46.0)
Hemoglobin: 12.7 g/dL (ref 12.0–15.0)
MCH: 28.8 pg (ref 26.0–34.0)
MCHC: 33.3 g/dL (ref 30.0–36.0)
MCV: 86.4 fL (ref 80.0–100.0)
Platelets: 302 10*3/uL (ref 150–400)
RBC: 4.41 MIL/uL (ref 3.87–5.11)
RDW: 13.8 % (ref 11.5–15.5)
WBC: 14.9 10*3/uL — ABNORMAL HIGH (ref 4.0–10.5)
nRBC: 0 % (ref 0.0–0.2)

## 2022-04-26 NOTE — Progress Notes (Signed)
Post Partum Day 1 Subjective: Doing well, no complaints.  Tolerating regular diet, pain with PO meds, voiding and ambulating without difficulty.  No CP SOB Fever,Chills, N/V or leg pain; denies nipple or breast pain, no HA change of vision, RUQ/epigastric pain  Objective: BP 115/77 (BP Location: Left Arm)   Pulse 77   Temp 98.5 F (36.9 C) (Oral)   Resp 18   Ht 5\' 4"  (1.626 m)   Wt 120.7 kg   LMP 07/31/2021   SpO2 98%   Breastfeeding Unknown   BMI 45.68 kg/m    Physical Exam:  General: NAD Breasts: soft/nontender CV: RRR Pulm: nl effort, CTABL Abdomen: soft, NT, BS x 4 Perineum: minimal edema, repair well approximated Lochia: moderate Uterine Fundus: fundus firm and 1 fb below umbilicus DVT Evaluation: no cords, ttp LEs   Recent Labs    04/25/22 0128 04/26/22 0554  HGB 13.8 12.7  HCT 41.0 38.1  WBC 15.2* 14.9*  PLT 355 302    Assessment/Plan: 31 y.o. G1P1001 postpartum day # 1  - Continue routine PP care - Lactation consult PRN - Discussed contraceptive options including implant, IUDs hormonal and non-hormonal, injection, pills/ring/patch, condoms, and NFP.  - Immunization status: all Imms up to date  Disposition: Does not desire Dc home today.   Gertie Fey, CNM 04/26/2022 8:50 AM

## 2022-04-26 NOTE — Anesthesia Postprocedure Evaluation (Signed)
Anesthesia Post Note  Patient: Vanessa Jimenez  Procedure(s) Performed: AN AD HOC LABOR EPIDURAL  Patient location during evaluation: Mother Baby Anesthesia Type: Epidural Level of consciousness: awake and alert, oriented and patient cooperative Pain management: pain level controlled Vital Signs Assessment: post-procedure vital signs reviewed and stable Respiratory status: spontaneous breathing, respiratory function stable and nonlabored ventilation Cardiovascular status: blood pressure returned to baseline and stable Postop Assessment: no headache, no backache, adequate PO intake and able to ambulate Anesthetic complications: no   No notable events documented.   Last Vitals:  Vitals:   04/25/22 2304 04/26/22 0304  BP: 111/70 119/74  Pulse: (!) 103 92  Resp: 20 18  Temp: 36.6 C 36.8 C  SpO2: 97% 97%    Last Pain:  Vitals:   04/26/22 0310  TempSrc:   PainSc: 0-No pain                 Darrin Nipper

## 2022-04-26 NOTE — Lactation Note (Signed)
This note was copied from a baby's chart. Lactation Consultation Note  Patient Name: Vanessa Jimenez [id number] Date: 04/26/2022 Reason for consult: Follow-up assessment;Primapara;Early term 37-38.6wks;Infant < 6lbs;Breastfeeding assistance Age:29 hours  Maternal Data Has patient been taught Hand Expression?: Yes Does the patient have breastfeeding experience prior to this delivery?: No  First time mom delivered early at [redacted]w[redacted]d.   Feeding Mother's Current Feeding Choice: Breast Milk Baby has been sleepy and uninterested in eating. Baby has had some feedings since delivery but several attempts that were unsuccessful. He has been syringe fed EBM (after mom pumps) 2-3x, but also is spitty/gaggy.   LATCH Score Latch: Repeated attempts needed to sustain latch, nipple held in mouth throughout feeding, stimulation needed to elicit sucking reflex.  Audible Swallowing: None  Type of Nipple: Flat  Comfort (Breast/Nipple): Soft / non-tender  Hold (Positioning): Assistance needed to correctly position infant at breast and maintain latch.  LATCH Score: 5  LC and RN attempted feeding at the breast in football hold on the L breast with use of nipple shield. Baby very calm, awake, but not cuing at all. Attempts to latch were unsuccessful, a rhythmic suck pattern was never established despite colostrum provided at breast via curved tip syringe.  Lactation Tools Discussed/Used Tools: Pump Nipple shield size: 20 Breast pump type: Double-Electric Breast Pump Pump Education: Setup, frequency, and cleaning;Milk Storage Reason for Pumping: baby sleepy Pumping frequency: q 3 hours  Interventions Interventions: Assisted with latch;Hand express;Adjust position;Support pillows;DEBP;Education;LPT handout/interventions (feeding plan implemented)  Implemented feeding plan: -Offer breast every 3 hours -Limit feeding attempts to 10 minutes and feedings at the breast to 20 minutes -Supplement with  appropriate volume of 5-30mL, either EBM or formula supplement to meet supplement goals. -Mom pump post feeding/feeding attempts at the breast.  Pacifier was given to be used between feedings and as sucking practice.   Discharge Pump: Personal  Consult Status Consult Status: Follow-up  Follow-up at feeding attempts.  Vanessa Jimenez 04/26/2022, 10:06 AM

## 2022-04-27 MED ORDER — DIBUCAINE (PERIANAL) 1 % EX OINT: 1.0000 | TOPICAL_OINTMENT | CUTANEOUS | Status: AC | PRN

## 2022-04-27 MED ORDER — IBUPROFEN 600 MG PO TABS: 600.0000 mg | ORAL_TABLET | Freq: Four times a day (QID) | ORAL | 0 refills | Status: AC

## 2022-04-27 MED ORDER — SENNOSIDES-DOCUSATE SODIUM 8.6-50 MG PO TABS: 2.0000 | ORAL_TABLET | Freq: Every day | ORAL | Status: AC

## 2022-04-27 MED ORDER — BENZOCAINE-MENTHOL 20-0.5 % EX AERO: 1.0000 | INHALATION_SPRAY | CUTANEOUS | Status: AC | PRN

## 2022-04-27 MED ORDER — WITCH HAZEL-GLYCERIN EX PADS: 1.0000 | MEDICATED_PAD | CUTANEOUS | 12 refills | Status: AC | PRN

## 2022-04-27 MED ORDER — SIMETHICONE 80 MG PO CHEW: 80.0000 mg | CHEWABLE_TABLET | ORAL | 0 refills | Status: AC | PRN

## 2022-04-27 MED ORDER — ACETAMINOPHEN 325 MG PO TABS: 650.0000 mg | ORAL_TABLET | ORAL | Status: AC | PRN

## 2022-04-27 MED ORDER — COCONUT OIL OIL: 1.0000 | TOPICAL_OIL | 0 refills | Status: AC | PRN

## 2022-04-27 NOTE — Lactation Note (Signed)
This note was copied from a baby's chart. Lactation Consultation Note  Patient Name: Vanessa Jimenez [id number] Date: 04/27/2022 Reason for consult: Follow-up assessment;Primapara;Early term 37-38.6wks Age:29 hours  Maternal Data Has patient been taught Hand Expression?: Yes Does the patient have breastfeeding experience prior to this delivery?: No  First time mom is following triple feeding plan well, desires to continue with current BF efforts.  Feeding Mother's Current Feeding Choice: Breast Milk  Baby BF when awake/alert enough, receiving either EBM or formula up to 67mL/feeding via bottle.  LATCH Score    Lactation Tools Discussed/Used Tools: Pump;Nipple [place] Fuel;Bottle Nipple shield size: 20 Breast pump type: Double-Electric Breast Pump Reason for Pumping: LPT protocol Pumping frequency: q 3 hours  Encouraged to continue with pumping efforts. Reviewed normal course of lactation, importance of consistency with pumping and breast stimulation, hand express pre/post pumping, and milk supply and demand.  Interventions Interventions: Breast feeding basics reviewed;Hand express;Pre-pump if needed;Reverse pressure;DEBP;Ice;Education;LPT handout/interventions (Feeding plan, milk supply and demand, normal course of lactation, stomach size, feeding patterns/behaviors)  Reviewed home feeding plan: -Put baby to breast/attempt BF with each feeding and especially when alert/awake -Provide EBM and/or formula up to appropriate volumes with every feeding (minimum every 3 hours) -Pump every 3 hours  Discharge Discharge Education: Engorgement and breast care;Warning signs for feeding baby;Outpatient recommendation Pump: Personal (Mom has 2 EBP's at home)  Reviewed anticipated breast changes and management of breast fullness/engorgement. Outpatient lactation number provided; encouraged to call with questions and ongoing BF support as needed.  Consult Status Consult Status:  Complete Date: 04/27/22 Follow-up type: Call as needed    Lavonia Drafts 04/27/2022, 10:12 AM

## 2022-04-27 NOTE — Plan of Care (Signed)
Pt in bed in low locked position, with call bell in reach. Family at the bedside. Pt bonding with baby. Pain well controlled with scheduled meds. Will continue to monitor.

## 2022-04-27 NOTE — Discharge Instructions (Signed)
Discharge Instructions:   Follow-up Appointment: Call [phone] to schedule 6wk postpartum appointment.  If there are any new medications, they have been ordered and will be available for pickup at the listed pharmacy on your way home from the hospital.   Call office if you have any of the following: headache, visual changes, fever >101.0 F, chills, shortness of breath, breast concerns, excessive vaginal bleeding, incision drainage or problems, leg pain or redness, depression or any other concerns. If you have vaginal discharge with an odor, let your doctor know.   It is normal to bleed for up to 6 weeks. You should not soak through more than 1 pad in 1 hour. If you have a blood clot larger than your fist with continued bleeding, call your doctor.   Activity: Do not lift > 10 lbs for 6 weeks (do not lift anything heavier than your baby). No intercourse, tampons, swimming pools, hot tubs, baths (only showers) for 6 weeks.  No driving for 1-2 weeks. Continue prenatal vitamin, especially if breastfeeding. Increase calories and fluids (water) while breastfeeding.   Your milk will come in, in the next couple of days (right now it is colostrum). You may have a slight fever when your milk comes in, but it should go away on its own.  If it does not, and rises above 101 F please call the doctor. You will also feel achy and your breasts will be firm. They will also start to leak. If you are breastfeeding, continue as you have been and you can pump/express milk for comfort.   If you have too much milk, your breasts can become engorged, which could lead to mastitis. This is an infection of the milk ducts. It can be very painful and you will need to notify your doctor to obtain a prescription for antibiotics. You can also treat it with a shower or hot/cold compress.   For concerns about your baby, please call your pediatrician.  For breastfeeding concerns, the lactation consultant can be reached at  [phone].   Postpartum blues (feelings of happy one minute and sad another minute) are normal for the first few weeks but if it gets worse let your doctor know.   Congratulations! We enjoyed caring for you and your new bundle of joy!

## 2022-04-27 NOTE — Progress Notes (Signed)
Patient discharged home with infant. Discharge instructions and prescriptions given and reviewed with patient. Patient verbalized understanding. Escorted out by volunteer.
